# Patient Record
Sex: Male | Born: 1970
Health system: Southern US, Community
[De-identification: ages and names within clinical notes are randomized; demographics above are authoritative.]

---

## 2001-06-09 ENCOUNTER — Ambulatory Visit (HOSPITAL_BASED_OUTPATIENT_CLINIC_OR_DEPARTMENT_OTHER): Admission: RE | Admit: 2001-06-09 | Discharge: 2001-06-09 | Payer: Self-pay | Admitting: Plastic Surgery

## 2002-02-15 ENCOUNTER — Ambulatory Visit (HOSPITAL_BASED_OUTPATIENT_CLINIC_OR_DEPARTMENT_OTHER): Admission: RE | Admit: 2002-02-15 | Discharge: 2002-02-15 | Payer: Self-pay | Admitting: Plastic Surgery

## 2004-06-30 ENCOUNTER — Encounter: Admission: RE | Admit: 2004-06-30 | Discharge: 2004-06-30 | Payer: Self-pay | Admitting: Internal Medicine

## 2017-05-25 DIAGNOSIS — E291 Testicular hypofunction: Secondary | ICD-10-CM | POA: Diagnosis not present

## 2017-05-25 DIAGNOSIS — R7989 Other specified abnormal findings of blood chemistry: Secondary | ICD-10-CM | POA: Diagnosis not present

## 2017-05-31 DIAGNOSIS — M7711 Lateral epicondylitis, right elbow: Secondary | ICD-10-CM | POA: Diagnosis not present

## 2017-06-04 DIAGNOSIS — R7989 Other specified abnormal findings of blood chemistry: Secondary | ICD-10-CM | POA: Diagnosis not present

## 2017-06-04 DIAGNOSIS — E291 Testicular hypofunction: Secondary | ICD-10-CM | POA: Diagnosis not present

## 2017-07-01 DIAGNOSIS — M7711 Lateral epicondylitis, right elbow: Secondary | ICD-10-CM | POA: Diagnosis not present

## 2017-11-12 DIAGNOSIS — E291 Testicular hypofunction: Secondary | ICD-10-CM | POA: Diagnosis not present

## 2017-11-18 DIAGNOSIS — H66002 Acute suppurative otitis media without spontaneous rupture of ear drum, left ear: Secondary | ICD-10-CM | POA: Diagnosis not present

## 2017-11-18 DIAGNOSIS — E291 Testicular hypofunction: Secondary | ICD-10-CM | POA: Diagnosis not present

## 2017-11-18 DIAGNOSIS — J0100 Acute maxillary sinusitis, unspecified: Secondary | ICD-10-CM | POA: Diagnosis not present

## 2017-12-23 DIAGNOSIS — Z23 Encounter for immunization: Secondary | ICD-10-CM | POA: Diagnosis not present

## 2017-12-23 DIAGNOSIS — H66002 Acute suppurative otitis media without spontaneous rupture of ear drum, left ear: Secondary | ICD-10-CM | POA: Diagnosis not present

## 2017-12-23 DIAGNOSIS — Z125 Encounter for screening for malignant neoplasm of prostate: Secondary | ICD-10-CM | POA: Diagnosis not present

## 2017-12-23 DIAGNOSIS — E291 Testicular hypofunction: Secondary | ICD-10-CM | POA: Diagnosis not present

## 2018-02-08 DIAGNOSIS — L814 Other melanin hyperpigmentation: Secondary | ICD-10-CM | POA: Diagnosis not present

## 2018-02-08 DIAGNOSIS — D225 Melanocytic nevi of trunk: Secondary | ICD-10-CM | POA: Diagnosis not present

## 2018-02-08 DIAGNOSIS — D485 Neoplasm of uncertain behavior of skin: Secondary | ICD-10-CM | POA: Diagnosis not present

## 2019-05-06 ENCOUNTER — Emergency Department (HOSPITAL_BASED_OUTPATIENT_CLINIC_OR_DEPARTMENT_OTHER)
Admission: EM | Admit: 2019-05-06 | Discharge: 2019-05-06 | Disposition: A | Discharge status: Home / Self Care | Payer: Commercial Managed Care - PPO | Attending: Emergency Medicine | Admitting: Emergency Medicine

## 2019-05-06 ENCOUNTER — Other Ambulatory Visit: Payer: Self-pay

## 2019-05-06 ENCOUNTER — Emergency Department (HOSPITAL_BASED_OUTPATIENT_CLINIC_OR_DEPARTMENT_OTHER): Payer: Commercial Managed Care - PPO

## 2019-05-06 ENCOUNTER — Encounter (HOSPITAL_BASED_OUTPATIENT_CLINIC_OR_DEPARTMENT_OTHER): Payer: Self-pay | Admitting: Emergency Medicine

## 2019-05-06 DIAGNOSIS — J1289 Other viral pneumonia: Secondary | ICD-10-CM | POA: Diagnosis not present

## 2019-05-06 DIAGNOSIS — U071 COVID-19: Secondary | ICD-10-CM | POA: Insufficient documentation

## 2019-05-06 DIAGNOSIS — J1282 Pneumonia due to coronavirus disease 2019: Secondary | ICD-10-CM

## 2019-05-06 DIAGNOSIS — Z79899 Other long term (current) drug therapy: Secondary | ICD-10-CM | POA: Insufficient documentation

## 2019-05-06 DIAGNOSIS — R0602 Shortness of breath: Secondary | ICD-10-CM | POA: Diagnosis present

## 2019-05-06 NOTE — ED Provider Notes (Signed)
[facility] Provider Note   CSN: [id number] Arrival date & time: 05/06/19  1023     History Chief Complaint  Patient presents with  . Shortness of Breath    Shawn Burnett is a 49 y.o. male.  Presents to ER with chief complaint of shortness of breath.  Diagnosed with COVID-19 1 week ago.  Symptoms for last 8 or 9 days.  Initially having chills, then progressive cough, over the past few days has felt more short of breath.  Had been monitoring his oxygen closely at home and said it dipped to as low as 90% today but mostly has been in the mid and upper 90s.  States his wife is concerned he may have pneumonia.  Other provider prescribed steroids and inhaler.  No associated chest pain or chest pressure.  HPI     History reviewed. No pertinent past medical history.  There are no problems to display for this patient.   History reviewed. No pertinent surgical history.     No family history on file.  Social History   Tobacco Use  . Smoking status: Never Smoker  . Smokeless tobacco: Never Used  Substance Use Topics  . Alcohol use: Not Currently  . Drug use: Not Currently    Home Medications Prior to Admission medications   Medication Sig Start Date End Date Taking? Authorizing Provider  albuterol (VENTOLIN HFA) 108 (90 Base) MCG/ACT inhaler SMARTSIG:2 Puff(s) By Mouth Every 4-6 Hours PRN 05/02/19   [provider]  benzonatate (TESSALON) 200 MG capsule Take 200 mg by mouth 3 (three) times daily. 05/02/19   [provider]  citalopram (CELEXA) 40 MG tablet Take 40 mg by mouth daily. 05/02/19   [provider]  FLOVENT HFA 44 MCG/ACT inhaler  05/05/19   [provider]  predniSONE (STERAPRED UNI-PAK 21 TAB) 10 MG (21) TBPK tablet See admin instructions. 05/02/19   [provider]  promethazine-dextromethorphan (PROMETHAZINE-DM) 6.25-15 MG/5ML syrup Take 5 mLs by mouth every 6 (six) hours as needed.  05/02/19   [provider]  rosuvastatin (CRESTOR) 10 MG tablet Take 10 mg by mouth daily. 03/24/19   [provider]  testosterone cypionate (DEPOTESTOSTERONE CYPIONATE) 200 MG/ML injection SMARTSIG:0.75 Milliliter(s) IM Once a Week 03/30/19   [provider]    Allergies    Patient has no known allergies.  Review of Systems   Review of Systems  Constitutional: Positive for chills. Negative for fever.  HENT: Negative for ear pain and sore throat.   Eyes: Negative for pain and visual disturbance.  Respiratory: Positive for cough and shortness of breath.   Cardiovascular: Negative for chest pain and palpitations.  Gastrointestinal: Negative for abdominal pain and vomiting.  Genitourinary: Negative for dysuria and hematuria.  Musculoskeletal: Negative for arthralgias and back pain.  Skin: Negative for color change and rash.  Neurological: Negative for seizures and syncope.  All other systems reviewed and are negative.   Physical Exam Updated Vital Signs BP (!) 121/97   Pulse 72   Temp 99.1 F (37.3 C) (Oral)   Resp 11   Ht 6\' 1"  (1.854 m)   Wt 98.4 kg   SpO2 98%   BMI 28.63 kg/m   Physical Exam Vitals and nursing note reviewed.  Constitutional:      Appearance: He is well-developed.  HENT:     Head: Normocephalic and atraumatic.  Eyes:     Conjunctiva/sclera: Conjunctivae normal.  Cardiovascular:     Rate and  Rhythm: Normal rate and regular rhythm.     Heart sounds: No murmur.  Pulmonary:     Effort: Pulmonary effort is normal. No respiratory distress.     Breath sounds: Normal breath sounds.     Comments: No tachypnea, speaking full sentences Chest:     Chest wall: No mass or deformity.  Abdominal:     Palpations: Abdomen is soft.     Tenderness: There is no abdominal tenderness.  Musculoskeletal:     Cervical back: Neck supple.  Skin:    General: Skin is warm and dry.     Capillary Refill: Capillary refill takes less than 2 seconds.    Neurological:     General: No focal deficit present.     Mental Status: He is alert.     ED Results / Procedures / Treatments   Labs (all labs ordered are listed, but only abnormal results are displayed) Labs Reviewed - No data to display  EKG EKG Interpretation  Date/Time:  Saturday May 06 2019 11:45:33 EST Ventricular Rate:  63 PR Interval:    QRS Duration: 112 QT Interval:  394 QTC Calculation: 404 R Axis:   -4 Text Interpretation: Sinus rhythm Borderline intraventricular conduction delay Abnormal R-wave progression, early transition Minimal ST elevation, anterior leads no acute STEMI no prior for comparison Confirmed by Madalyn Rob [id number]) on 05/06/2019 11:52:12 AM   Radiology DG Chest Portable 1 View  Result Date: 05/06/2019 CLINICAL DATA:  Pt c/o SHOB and O2 sat at home was 90-91%; tested + for COVID last Friday; nonproductive cough, denies chest pain, chills No hx heart or lung conditions, nonsmoker EXAM: PORTABLE CHEST - 1 VIEW COMPARISON:  none FINDINGS: Patchy linear airspace opacities peripherally in both lungs. Interstitial prominence of both lung bases. Heart size upper limits normal. No effusion. No pneumothorax. Visualized bones unremarkable. IMPRESSION: Patchy peripheral and bibasilar opacities of uncertain chronicity. Electronically Signed   By: Lucrezia Europe M.D.   On: 05/06/2019 11:28    Procedures Procedures (including critical care time)  Medications Ordered in ED Medications - No data to display  ED Course  I have reviewed the triage vital signs and the nursing notes.  Pertinent labs & imaging results that were available during my care of the patient were reviewed by me and considered in my medical decision making (see chart for details).    MDM Rules/Calculators/A&P                      49 year old healthy, non-smoker presenting to ER with concern for some shortness of breath in setting of known COVID-19.  On day 8 or 9 of symptoms.  CXR  with some subtle infiltrates.  Suspect COVID-19 pneumonia.  Doubt bacterial superinfection.  Do not see indication for antibiotics at this time.  Patient already on steroids.  Given patient is well-appearing, no tachypnea, no hypoxia, no increased work of breathing, no associated chest pain, doubt pulmonary embolism.  Given current appearance, stable oxygen levels on room air, believe he is appropriate for discharge and outpatient management this time.  Reviewed strict return precautions with patient in detail.  Recommend virtual recheck with primary doctor next week.    After the discussed management above, the patient was determined to be safe for discharge.  The patient was in agreement with this plan and all questions regarding their care were answered.  ED return precautions were discussed and the patient will return to the ED with any significant worsening of condition.  Final Clinical Impression(s) / ED Diagnoses Final diagnoses:  COVID-19  Pneumonia due to COVID-19 virus    Rx / DC Orders ED Discharge Orders    None       Lucrezia Starch, MD 05/06/19 1450

## 2019-05-06 NOTE — ED Triage Notes (Signed)
Pt c/o SHOB and O2 sat at home was 90-91%; tested + for COVID last Friday; np cough

## 2019-05-06 NOTE — Discharge Instructions (Addendum)
If you develop any worsening of your shortness of breath, any chest pain, passing out or other new concerning symptom, please return immediately to the emergency department for reassessment.  Additionally if your oxygen levels are dropping below 90%, with also return to ER for reassessment.  Recommend getting a follow-up appointment virtually with your primary doctor for recheck next week.

## 2020-09-28 IMAGING — DX DG CHEST 1V PORT
1 series · 1 of 1 positions shown · non-contrast
Comparison: none

CLINICAL DATA: Pt c/o SHOB and O2 sat at home was 90-91%; tested +
for COVID [REDACTED]; nonproductive cough, denies chest pain,
chills No hx heart or lung conditions, nonsmoker

EXAM:
PORTABLE CHEST - 1 VIEW

[chest ap]
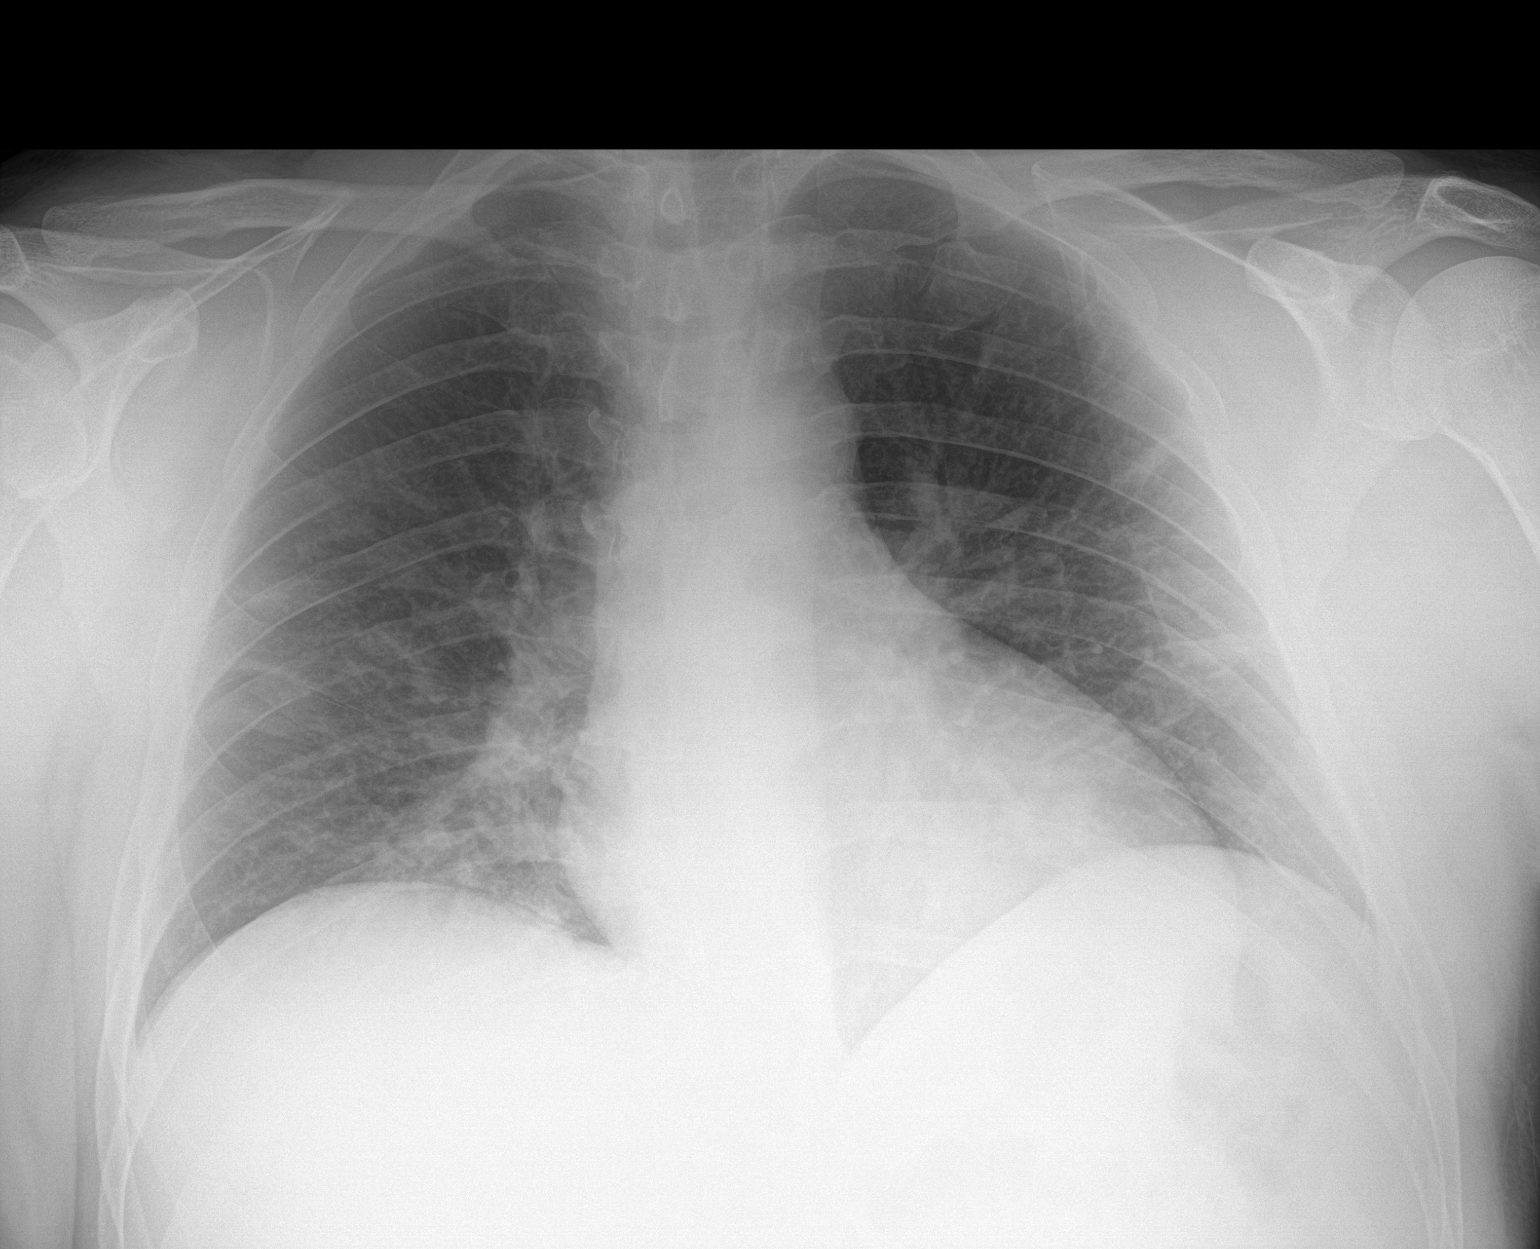

[1 of 1 positions shown; findings below may reference images not displayed]

FINDINGS: Patchy linear airspace opacities peripherally in both lungs.
Interstitial prominence of both lung bases.

Heart size upper limits normal.

No effusion. No pneumothorax.

Visualized bones unremarkable.
IMPRESSION: Patchy peripheral and bibasilar opacities of uncertain chronicity.
